# Patient Record
Sex: Male | Born: 1985 | Race: White | Hispanic: No | Marital: Single | Smoking: Current every day smoker
Health system: Southern US, Community
[De-identification: ages and names within clinical notes are randomized; demographics above are authoritative.]

## PROBLEM LIST (undated history)

## (undated) DIAGNOSIS — F32A Depression, unspecified: Secondary | ICD-10-CM

## (undated) DIAGNOSIS — R569 Unspecified convulsions: Secondary | ICD-10-CM

## (undated) DIAGNOSIS — F329 Major depressive disorder, single episode, unspecified: Secondary | ICD-10-CM

## (undated) DIAGNOSIS — F419 Anxiety disorder, unspecified: Secondary | ICD-10-CM

## (undated) HISTORY — DX: Anxiety disorder, unspecified: F41.9

## (undated) HISTORY — PX: APPENDECTOMY: SHX54

## (undated) HISTORY — DX: Depression, unspecified: F32.A

## (undated) HISTORY — DX: Major depressive disorder, single episode, unspecified: F32.9

---

## 2013-08-27 ENCOUNTER — Emergency Department (HOSPITAL_COMMUNITY): Payer: Self-pay

## 2013-08-27 ENCOUNTER — Emergency Department (HOSPITAL_COMMUNITY)
Admission: EM | Admit: 2013-08-27 | Discharge: 2013-08-27 | Disposition: A | Discharge status: Home / Self Care | Payer: Self-pay | Attending: Emergency Medicine | Admitting: Emergency Medicine

## 2013-08-27 ENCOUNTER — Encounter (HOSPITAL_COMMUNITY): Payer: Self-pay | Admitting: Family Medicine

## 2013-08-27 DIAGNOSIS — R569 Unspecified convulsions: Secondary | ICD-10-CM | POA: Insufficient documentation

## 2013-08-27 DIAGNOSIS — Z79899 Other long term (current) drug therapy: Secondary | ICD-10-CM | POA: Insufficient documentation

## 2013-08-27 DIAGNOSIS — F29 Unspecified psychosis not due to a substance or known physiological condition: Secondary | ICD-10-CM | POA: Insufficient documentation

## 2013-08-27 DIAGNOSIS — R55 Syncope and collapse: Secondary | ICD-10-CM | POA: Insufficient documentation

## 2013-08-27 DIAGNOSIS — R5381 Other malaise: Secondary | ICD-10-CM | POA: Insufficient documentation

## 2013-08-27 DIAGNOSIS — R51 Headache: Secondary | ICD-10-CM | POA: Insufficient documentation

## 2013-08-27 DIAGNOSIS — F17200 Nicotine dependence, unspecified, uncomplicated: Secondary | ICD-10-CM | POA: Insufficient documentation

## 2013-08-27 HISTORY — DX: Unspecified convulsions: R56.9

## 2013-08-27 LAB — COMPREHENSIVE METABOLIC PANEL
AST: 29 U/L (ref 0–37)
Alkaline Phosphatase: 68 U/L (ref 39–117)
BUN: 11 mg/dL (ref 6–23)
CO2: 22 mEq/L (ref 19–32)
Calcium: 9.4 mg/dL (ref 8.4–10.5)
Chloride: 100 mEq/L (ref 96–112)
Creatinine, Ser: 0.79 mg/dL (ref 0.50–1.35)
GFR calc non Af Amer: >90 mL/min (ref >90)
Glucose, Bld: 89 mg/dL (ref 70–99)
Potassium: 3.9 mEq/L (ref 3.5–5.1)
Total Bilirubin: 0.4 mg/dL (ref 0.3–1.2)

## 2013-08-27 LAB — RAPID URINE DRUG SCREEN, HOSP PERFORMED
Barbiturates: NOT DETECTED
Benzodiazepines: NOT DETECTED
Opiates: NOT DETECTED

## 2013-08-27 LAB — URINALYSIS, ROUTINE W REFLEX MICROSCOPIC
Bilirubin Urine: NEGATIVE
Ketones, ur: NEGATIVE mg/dL
Leukocytes, UA: NEGATIVE
Nitrite: NEGATIVE
Urobilinogen, UA: 0.2 mg/dL (ref 0.0–1.0)
pH: 5.5 (ref 5.0–8.0)

## 2013-08-27 LAB — CBC
MCH: 32.6 pg (ref 26.0–34.0)
MCV: 89.1 fL (ref 78.0–100.0)
Platelets: 245 10*3/uL (ref 150–400)
RBC: 4.87 MIL/uL (ref 4.22–5.81)
RDW: 12.6 % (ref 11.5–15.5)
WBC: 14.5 10*3/uL — ABNORMAL HIGH (ref 4.0–10.5)

## 2013-08-27 LAB — MAGNESIUM: Magnesium: 2.3 mg/dL (ref 1.5–2.5)

## 2013-08-27 LAB — URINE MICROSCOPIC-ADD ON

## 2013-08-27 NOTE — ED Provider Notes (Signed)
CSN: [id number]     Arrival date & time 08/27/13  1452 History   First MD Initiated Contact with Patient 08/27/13 1506     Chief Complaint  Patient presents with  . Seizures   HPI Pt is a 27 y/o M with PMHx of seizures who presents to the ED following a seizure at work.  Pt states he has had 2 seizures in the past averaging every 2-3 years for the past 5 yrs and had never had these previous.  Precipatating factors at that time that were noticed inducing factors were fatigue.  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  Previous seizures were not induced by fever, flashing lights, load music or emotions, and denies any of these events at this time.  Pt remember feeling slightly tired around 1:45 or so, and after this remembers waking up in the ambulance on the way to the hospital around 2:30.  Per reports from his co-workers, pt slowly fell to the ground, had tonic-clonic like episodes lasting anywhere from 5-10 minutes, slowly came to after this before paramedics arrived around 2:15 PM.  He felt extremely confused at this time, had a HA, and felt extremely exhausted before finally feeling near normal around 2:45 PM-3PM when he arrived at the hospital.  He denies previous aura like Sx including change in smell, taste, drooling, or out of body experience.  Denies any fever, chills, sweats, recent sick contacts, neurologic deficits, loss of bladder or bowel, or any specific joint pain.  Does state he bite his tongue, minimally.  Denies N/V/D or recent travel, CP, SOB, abdominal pain, dysuria, hematuria, or recent sexual encounters.    Pt does endorse smoking 1 ppd for the last 10 yrs, as well as about a beer/week, and occasional marijuana/oxycodone or xanax usage.  Last used marijuana two nights ago and endorses alcohol use at that time but no other illicit substance use.    Past Medical History  Diagnosis Date  . Seizures   . Seizures    No past surgical history on file. Family History  Problem Relation Age of Onset  . Seizures Other    History  Substance Use Topics  . Smoking status: Current Every Day Smoker -- 1.00 packs/day for 10 years    Types: Cigarettes  . Smokeless tobacco: Never Used  . Alcohol Use: Yes     Comment: 1 case beer per week    Review of Systems  Constitutional: Positive for fatigue. Negative for fever, chills, diaphoresis and activity change.  HENT: Negative for sore throat, neck pain and neck stiffness.   Eyes: Negative for photophobia and pain.  Respiratory: Negative for cough, chest tightness, shortness of breath and wheezing.   Cardiovascular: Negative for chest pain, palpitations and leg swelling.  Gastrointestinal: Negative for nausea, vomiting, abdominal pain and diarrhea.  Genitourinary: Negative.  Negative for dysuria.  Musculoskeletal: Negative for myalgias, back pain, joint swelling, arthralgias and gait problem.  Skin: Negative.   Allergic/Immunologic: Negative.   Neurological: Positive for seizures, syncope and headaches. Negative for dizziness, tremors, facial asymmetry, speech difficulty, weakness, light-headedness and numbness.  Hematological: Negative.   Psychiatric/Behavioral: Positive for confusion. Negative for behavioral problems, decreased concentration and agitation.  All other systems reviewed and are negative.    Allergies  Tylenol  Home Medications   Current Outpatient Rx  Name  Route  Sig  Dispense  Refill  . Citalopram Hydrobromide (CELEXA PO)   Oral   Take 1 tablet by mouth daily.         . ranitidine (ZANTAC) 150 MG tablet   Oral   Take 150 mg by mouth 2 (two) times daily as needed for heartburn.          BP 143/90  Pulse 114  Temp(Src) 98 F (36.7 C) (Oral)  Resp 20  SpO2 98% Physical Exam  Nursing note and vitals reviewed. Constitutional: He is oriented to person,  place, and time. He appears well-developed and well-nourished. No distress.  HENT:  Head: Normocephalic and atraumatic. Head is without abrasion, without contusion, without laceration, without right periorbital erythema and without left periorbital erythema.  Right Ear: Hearing, tympanic membrane, external ear and ear canal normal.  Left Ear: Hearing, tympanic membrane, external ear and ear canal normal.  Nose: Nose normal.  Mouth/Throat: Oropharynx is clear and moist. Mucous membranes are not dry.    Eyes: Conjunctivae, EOM and lids are normal. Pupils are equal, round, and reactive to light.  Neck: Normal range of motion and full passive range of motion without pain. No JVD present. No spinous process tenderness and no muscular tenderness present. No Brudzinski's sign and no Kernig's sign noted.  Cardiovascular: Normal rate, regular rhythm, normal heart sounds and intact distal pulses.   No murmur heard. Pulmonary/Chest: Effort normal and breath sounds normal.  Abdominal: Normal appearance and bowel sounds are normal. There is no tenderness.  Musculoskeletal: Normal range of motion.  Neurological: He is alert and oriented to person, place, and time. He has normal strength and normal reflexes. He displays no atrophy and no tremor. No cranial nerve deficit or sensory deficit. He exhibits normal muscle tone. He displays no seizure activity. Coordination normal. GCS eye subscore is 4. GCS verbal subscore is 5. GCS motor subscore is 6.  Skin: Skin is warm, dry and intact. He is not diaphoretic.  Psychiatric: He has a normal mood and affect. His speech is normal and behavior is normal. Judgment and thought content normal. Cognition and memory are normal.    ED Course  Procedures (including critical care time)  Date: 08/27/2013  Rate: 92  Rhythm: normal sinus rhythm  QRS Axis: normal  Intervals: normal  ST/T Wave abnormalities: normal  Conduction Disutrbances: none  Narrative Interpretation:   NSR, no previous to compare to  Labs Review Labs Reviewed  CBC - Abnormal; Notable for the following:    WBC 14.5 (*)    MCHC 36.6 (*)    All other components within normal limits  URINALYSIS, ROUTINE W REFLEX MICROSCOPIC - Abnormal; Notable for the following:    Hgb urine dipstick SMALL (*)    Protein, ur 100 (*)    All other components within normal limits  URINE RAPID DRUG SCREEN (HOSP PERFORMED) - Abnormal; Notable for the following:    Tetrahydrocannabinol POSITIVE (*)    All other components within normal limits  COMPREHENSIVE METABOLIC PANEL  MAGNESIUM  URINE MICROSCOPIC-ADD ON   Imaging Review Dg Chest 1 View  08/27/2013   *RADIOLOGY REPORT*  Clinical Data: Seizures, weakness, short of breath  CHEST - 1 VIEW  Comparison: None.  Findings: Low inspiratory volumes with mild linear bibasilar opacities favored to reflect atelectasis.  Cardiac and mediastinal contours within normal limits for size.  No pneumothorax, pleural effusion or focal airspace consolidation.  No acute osseous abnormality.  IMPRESSION: Low inspiratory volumes with probable bibasilar subsegmental atelectasis.  Given the clinical history, small volume aspiration is a consideration.   Original Report Authenticated By: Malachy Moan, M.D.    MDM   1. Seizure   Pt H&P very consistent with seizure.  He had increased fatigue, period of 5-10 minutes of tonic-clonic activity w/ bitten tongue, as well as confusion and post-ictal state w/ HA.  He did not hit his head and has a [place]/AT skull w/o any focal neurologic deficits.  Will get CBC, CMP, UA/UDS, CXR, EKG, and Mg and reassess after giving some fluids.  He averages one seizure every 2-3 yrs and do not see the benefit vs risk in starting pt on anti-epileptic.  Will give d/c instructions on specifications on when to return to the ED and information about the local neurology group in town for him to f/u with.    4:59 PM - Pt lab work showing slight leukocytosis consistent  with seizure and possible small aspirate on CXR.    6:57 PM - Discussed case with Dr. Thad Ranger, on call neurologist, who recommends no driving and not to start on anti-epileptic at this time.  Pt will not be allowed to drive, discussed this with him at length, and needs follow up with the neurology group in town prior to being cleared to drive.  Will not start him on anti-epileptics at this time and will not Rx ABx at this time as well since he is not having coughing or any fevers.    Twana First Paulina Fusi, DO of [facility] 08/27/2013, 7:01 PM     Briscoe Deutscher, DO 08/27/13 1901

## 2013-08-27 NOTE — ED Notes (Signed)
Per EMS- patient was at work and had a seizure. Patient has not had a seizure x 2 years. Patient had oral trauma but did not bite her tongue. Patient received 400 ml NS prior to arrival.

## 2013-08-27 NOTE — ED Notes (Signed)
Bed: WA18 Expected date:  Expected time:  Means of arrival:  Comments: EMS- seizure 

## 2013-09-04 ENCOUNTER — Ambulatory Visit (INDEPENDENT_AMBULATORY_CARE_PROVIDER_SITE_OTHER): Payer: Self-pay | Admitting: Diagnostic Neuroimaging

## 2013-09-04 ENCOUNTER — Encounter: Payer: Self-pay | Admitting: Diagnostic Neuroimaging

## 2013-09-04 VITALS — BP 131/86 | HR 100 | Temp 98.3°F | Ht 72.0 in | Wt 243.5 lb

## 2013-09-04 DIAGNOSIS — G40909 Epilepsy, unspecified, not intractable, without status epilepticus: Secondary | ICD-10-CM | POA: Insufficient documentation

## 2013-09-04 MED ORDER — LEVETIRACETAM 500 MG PO TABS: 500.0000 mg | ORAL_TABLET | Freq: Two times a day (BID) | ORAL | Status: AC

## 2013-09-04 NOTE — Patient Instructions (Signed)
No driving until seizure free x 6 months.  Take levetiracetam 500mg  twice a day.  Establish with primary care physician.

## 2013-09-04 NOTE — Progress Notes (Signed)
[facility]  PATIENT: Mike Jones DOB: August 21, 1986  REFERRING CLINICIAN: ER  HISTORY FROM: patient and aunt REASON FOR VISIT: new consult   HISTORICAL  CHIEF COMPLAINT:  Chief Complaint  Patient presents with  . Seizures    ER follow up    HISTORY OF PRESENT ILLNESS:   27 year old male with depression, anxiety, here for evaluation of seizures.  First seizure of life occurred at age 23 years old in setting of sleep deprivation. Second seizure occurred one year later. Patient had EEG and MRI of the brain in the past, but we do not have those results reviewed review today. He thinks the results were unremarkable.   Third seizure occurred on 08/27/2013, while patient was at work. Apparently he told his coworkers that he "didn't feel good" and then proceeded into a generalized convulsive seizure. He had tongue biting and shaking for approximately 5-10 minutes. He was very foggy waking up but does remember getting into the ambulance. Patient was taken to the hospital and evaluated. Lab testing and chest x-ray were done. Patient was not started on antiseizure medication. Triggering factors includes a perforation that for the event.  Patient has chronic insomnia, anxiety, pain, and has been taking Celexa, Xanax, Percocet as needed, without prescriptions. Patient endorses marijuana use as well. No stimulant medications.  Patient reports being born full-term, without complication. He has 2 brothers without seizure disorder. Patient's paternal grandmother and paternal grandfather both had seizure disorder. No prior meningitis or head trauma.  REVIEW OF SYSTEMS: Full 14 system review of systems performed and notable only for seizure passing out depression anxiety insomnia snoring.  ALLERGIES: Allergies  Allergen Reactions  . Tylenol [Acetaminophen] Anaphylaxis    HOME MEDICATIONS: Outpatient Prescriptions Prior to Visit  Medication Sig Dispense Refill  . Citalopram  Hydrobromide (CELEXA PO) Take 1 tablet by mouth daily.      . ranitidine (ZANTAC) 150 MG tablet Take 150 mg by mouth 2 (two) times daily as needed for heartburn.       No facility-administered medications prior to visit.    PAST MEDICAL HISTORY: Past Medical History  Diagnosis Date  . Seizures   . Seizures   . Anxiety and depression     PAST SURGICAL HISTORY: Past Surgical History  Procedure Laterality Date  . Appendectomy      FAMILY HISTORY: Family History  Problem Relation Age of Onset  . Seizures Other   . Hypertension Father   . Seizures Paternal Grandmother     SOCIAL HISTORY:  History   Social History  . Marital Status: Single    Spouse Name: N/A    Number of Children: 0  . Years of Education: College   Occupational History  .  [employer]   Social History Main Topics  . Smoking status: Current Every Day Smoker -- 1.00 packs/day for 10 years    Types: Cigarettes  . Smokeless tobacco: Never Used  . Alcohol Use: Yes     Comment: 1 case beer per week  . Drug Use: Yes    Special: Marijuana     Comment: xanax occasinally and daily marijuana use  . Sexual Activity: Yes   Other Topics Concern  . Not on file   Social History Narrative   Patient lives at home with his aunt.   Caffeine Use: 4-5 cups daily     PHYSICAL EXAM  Filed Vitals:   09/04/13 0924  BP: 131/86  Pulse: 100  Temp: 98.3 F (36.8 C)  TempSrc: Oral  Height: 6' (1.829 m)  Weight: 243 lb 8 oz (110.451 kg)    Not recorded    Body mass index is 33.02 kg/(m^2).  GENERAL EXAM: Patient is in no distress; GYNECOMASTIA.  CARDIOVASCULAR: Regular rate and rhythm, no murmurs, no carotid bruits  NEUROLOGIC: MENTAL STATUS: awake, alert, language fluent, comprehension intact, naming intact CRANIAL NERVE: no papilledema on fundoscopic exam, pupils equal and reactive to light, visual fields full to confrontation, extraocular muscles intact, no nystagmus, facial sensation and  strength symmetric, uvula midline, shoulder shrug symmetric, tongue midline. MOTOR: normal bulk and tone, full strength in the BUE, BLE SENSORY: normal and symmetric to light touch, pinprick, temperature, vibration COORDINATION: finger-nose-finger, fine finger movements normal REFLEXES: deep tendon reflexes present and symmetric GAIT/STATION: narrow based gait; able to walk on toes, heels and tandem; romberg is negative   DIAGNOSTIC DATA (LABS, IMAGING, TESTING) - I reviewed patient records, labs, notes, testing and imaging myself where available.  Lab Results  Component Value Date   WBC 14.5* 08/27/2013   HGB 15.9 08/27/2013   HCT 43.4 08/27/2013   MCV 89.1 08/27/2013   PLT 245 08/27/2013      Component Value Date/Time   NA 135 08/27/2013 1545   K 3.9 08/27/2013 1545   CL 100 08/27/2013 1545   CO2 22 08/27/2013 1545   GLUCOSE 89 08/27/2013 1545   BUN 11 08/27/2013 1545   CREATININE 0.79 08/27/2013 1545   CALCIUM 9.4 08/27/2013 1545   PROT 7.4 08/27/2013 1545   ALBUMIN 4.2 08/27/2013 1545   AST 29 08/27/2013 1545   ALT 43 08/27/2013 1545   ALKPHOS 68 08/27/2013 1545   BILITOT 0.4 08/27/2013 1545   GFRNONAA >90 08/27/2013 1545   GFRAA >90 08/27/2013 1545   No results found for this basename: CHOL, HDL, LDLCALC, LDLDIRECT, TRIG, CHOLHDL   No results found for this basename: HGBA1C   No results found for this basename: VITAMINB12   No results found for this basename: TSH   I reviewed images myself and agree with interpretation.  08/27/13 CXR - Low inspiratory volumes with probable bibasilar subsegmental  atelectasis. Given the clinical history, small volume aspiration  is a consideration. No pneumothorax, pleural  effusion or focal airspace consolidation. No acute osseous  abnormality.   ASSESSMENT AND PLAN  27 y.o. year old male here with SEIZURE on 08/27/13. 3rd seizure of life.   PLAN: - start LEV 500mg  BID - no driving until sz free x 6 months - I recommended MRI brain and  EEG, but unfortunately, patient has no insurance, no PCP, no financial means to pay for testing -encouraged patient to apply for insurance/medicaid, establish with PCP and get referral for psychiatry, to help with anxiety/insomnia  Meds ordered this encounter  Medications  . levETIRAcetam (KEPPRA) 500 MG tablet    Sig: Take 1 tablet (500 mg total) by mouth 2 (two) times daily.    Dispense:  120 tablet    Refill:  11    Return in about 6 months (around 03/05/2014) for with Heide Guile.  I reviewed images, labs, notes, records myself. I summarized findings and reviewed with patient, for this high risk condition (seizure) requiring high complexity decision making.   Suanne Marker, MD 09/04/2013, 10:50 AM Certified in Neurology, Neurophysiology and Neuroimaging  [facility] [address] [place], [place] [address] [phone]

## 2013-09-08 NOTE — ED Provider Notes (Signed)
I saw and evaluated the patient, reviewed the resident's note and I agree with the findings and plan.   .Face to face Exam:  General:  Awake HEENT:  Atraumatic Resp:  Normal effort Abd:  Nondistended Neuro:No focal weakness  Nelia Shi, MD 09/08/13 [phone]

## 2013-10-05 ENCOUNTER — Other Ambulatory Visit: Payer: Self-pay

## 2013-12-11 ENCOUNTER — Telehealth: Payer: Self-pay | Admitting: Diagnostic Neuroimaging

## 2013-12-11 ENCOUNTER — Encounter: Payer: Self-pay | Admitting: Diagnostic Neuroimaging

## 2013-12-11 NOTE — Telephone Encounter (Signed)
Left message for patient stating 03/06/14 appointment with Larita FifeLynn has been rescheduled to 03/14/14 per Lynn's schedule (not in office). Voicemail message was cut off and unable to finish sending message, printed letter and sent.

## 2014-03-06 ENCOUNTER — Ambulatory Visit: Payer: Self-pay | Admitting: Nurse Practitioner

## 2014-03-14 ENCOUNTER — Telehealth: Payer: Self-pay | Admitting: Nurse Practitioner

## 2014-03-14 ENCOUNTER — Ambulatory Visit: Payer: Self-pay | Admitting: Nurse Practitioner

## 2014-03-14 NOTE — Telephone Encounter (Signed)
Patient was no show for today's office appointment.  

## 2014-09-10 ENCOUNTER — Other Ambulatory Visit: Payer: Self-pay | Admitting: Diagnostic Neuroimaging

## 2014-09-10 NOTE — Telephone Encounter (Signed)
Hasn't been seen in over 1 year; No Showed last appt

## 2014-09-10 NOTE — Telephone Encounter (Signed)
Patient has not been seen in over 1 year; No showed last appt

## 2014-10-11 IMAGING — CR DG CHEST 1V
1 series · 1 of 1 positions shown · non-contrast
Comparison: None.

CLINICAL DATA: Seizures, weakness, short of breath

CHEST - 1 VIEW

[AP]
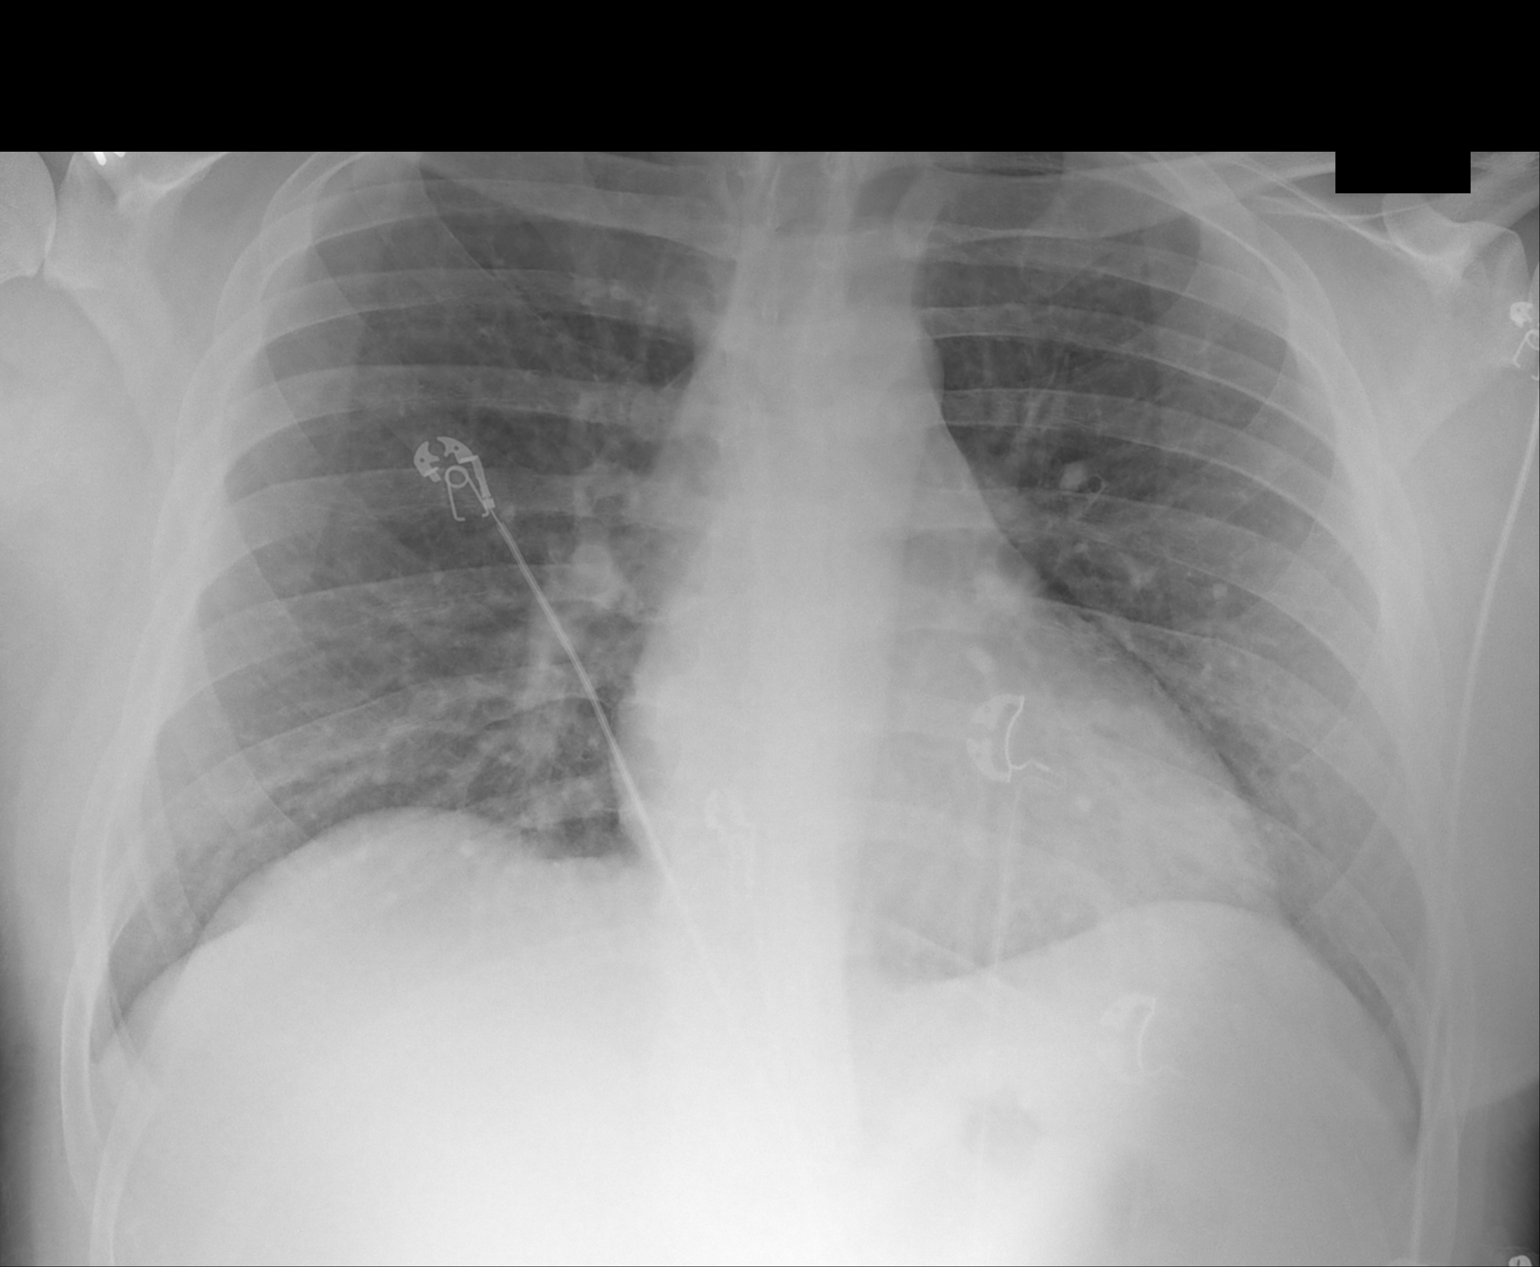

[1 of 1 positions shown; findings below may reference images not displayed]

FINDINGS: Low inspiratory volumes with mild linear bibasilar
opacities favored to reflect atelectasis.  Cardiac and mediastinal
contours within normal limits for size.  No pneumothorax, pleural
effusion or focal airspace consolidation.  No acute osseous
abnormality.
IMPRESSION: Low inspiratory volumes with probable bibasilar subsegmental
atelectasis.  Given the clinical history, small volume aspiration
is a consideration.
